# Patient Record
Sex: Male | Born: 1955 | Race: White | Hispanic: No | Marital: Married | Smoking: Never smoker
Health system: Southern US, Community
[De-identification: ages and names within clinical notes are randomized; demographics above are authoritative.]

---

## 2020-12-01 ENCOUNTER — Emergency Department (INDEPENDENT_AMBULATORY_CARE_PROVIDER_SITE_OTHER): Payer: Self-pay

## 2020-12-01 ENCOUNTER — Other Ambulatory Visit: Payer: Self-pay

## 2020-12-01 ENCOUNTER — Emergency Department
Admission: EM | Admit: 2020-12-01 | Discharge: 2020-12-01 | Disposition: A | Discharge status: Home / Self Care | Payer: Self-pay | Source: Home / Self Care

## 2020-12-01 DIAGNOSIS — J189 Pneumonia, unspecified organism: Secondary | ICD-10-CM

## 2020-12-01 DIAGNOSIS — U071 COVID-19: Secondary | ICD-10-CM

## 2020-12-01 DIAGNOSIS — J1282 Pneumonia due to coronavirus disease 2019: Secondary | ICD-10-CM

## 2020-12-01 MED ORDER — AMOXICILLIN-POT CLAVULANATE 875-125 MG PO TABS: 1.0000 | ORAL_TABLET | Freq: Two times a day (BID) | ORAL | 0 refills | Status: AC

## 2020-12-01 MED ORDER — DEXAMETHASONE SODIUM PHOSPHATE 10 MG/ML IJ SOLN
10.0000 mg | Freq: Once | INTRAMUSCULAR | Status: AC
  Administered 2020-12-01: 10 mg via INTRAMUSCULAR

## 2020-12-01 MED ORDER — AZITHROMYCIN 250 MG PO TABS: ORAL_TABLET | ORAL | 0 refills | Status: AC

## 2020-12-01 MED ORDER — AMOXICILLIN-POT CLAVULANATE 875-125 MG PO TABS: 1.0000 | ORAL_TABLET | Freq: Two times a day (BID) | ORAL | 0 refills | Status: DC

## 2020-12-01 MED ORDER — BENZONATATE 100 MG PO CAPS: 200.0000 mg | ORAL_CAPSULE | Freq: Three times a day (TID) | ORAL | 0 refills | Status: AC | PRN

## 2020-12-01 MED ORDER — PREDNISONE 20 MG PO TABS: 40.0000 mg | ORAL_TABLET | Freq: Every day | ORAL | 0 refills | Status: AC

## 2020-12-01 MED ORDER — AZITHROMYCIN 250 MG PO TABS: ORAL_TABLET | ORAL | 0 refills | Status: DC

## 2020-12-01 NOTE — ED Triage Notes (Signed)
Pt c/o cough, congestion and neck pain since Jan 2. Pt states at home test came up pos same day. States hes been having sharp shooting pain in his neck as well. Took one dose of 12 mg Ivermectin he had as an rx from travels to [place]. Also motrin prn.

## 2020-12-01 NOTE — ED Provider Notes (Signed)
[facility]    CSN: [id number] Arrival date & time: 12/01/20  1307      History   Chief Complaint Chief Complaint  Patient presents with  . Cough    Covid pos  . Neck Pain  . Nasal Congestion    HPI Gregory Huber is a 65 y.o. male.   HPI   Patient presents today for evaluation of a worsening cough, with shortness of breath, neck pain and nasal congestion.  Patient was diagnosed as COVID-19 positive approximately 8 days ago.  He is unvaccinated against COVID-19.  He has been managing symptoms with OTC medication along with taking 1 dose of ivermectin in which he acquired from and [place].  He reports symptoms have worsened.  He has a low-grade fever on arrival here today.  He has been checking oxygen levels at home and denies any readings consistent with hypoxia.  He denies any knowledge of any underlying conditions such as cardiovascular disease, diabetes, or chronic lung disease.  He has no previous history of smoking and is not obese.  History reviewed. No pertinent past medical history.  There are no problems to display for this patient.   History reviewed. No pertinent surgical history.     Home Medications    Prior to Admission medications   Not on File    Family History History reviewed. No pertinent family history.  Social History Social History   Tobacco Use  . Smoking status: Never Smoker  . Smokeless tobacco: Never Used  Vaping Use  . Vaping Use: Never used  Substance Use Topics  . Alcohol use: Not Currently     Allergies   Patient has no known allergies.   Review of Systems Review of Systems Pertinent negatives listed in HPI Physical Exam Triage Vital Signs ED Triage Vitals  Enc Vitals Group     BP 12/01/20 1514 124/80     Pulse Rate 12/01/20 1514 97     Resp 12/01/20 1514 18     Temp 12/01/20 1514 99.6 F (37.6 C)     Temp Source 12/01/20 1514 Oral     SpO2 12/01/20 1514 96 %     Weight --       Height --      Head Circumference --      Peak Flow --      Pain Score 12/01/20 1515 2     Pain Loc --      Pain Edu? --      Excl. in GC? --    No data found.  Updated Vital Signs BP 124/80 (BP Location: Right Arm)   Pulse 97   Temp 99.6 F (37.6 C) (Oral)   Resp 18   SpO2 96%   Visual Acuity Right Eye Distance:   Left Eye Distance:   Bilateral Distance:    Right Eye Near:   Left Eye Near:    Bilateral Near:     Physical Exam General appearance: alert, Ill-appearing, no distress Head: Normocephalic, without obvious abnormality, atraumatic ENT: mucosal edema, congestion, oropharynx w/o exudate Respiratory: Respirations even , unlabored, coarse lung sounds, (+) wheeze, decreases lung sound (generalized) Heart: rate and rhythm normal. No gallop or murmurs noted on exam  Abdomen: BS +, no distention, no rebound tenderness, or no mass Extremities: No gross deformities Skin: Skin color, texture, turgor normal. No rashes seen  Psych: Appropriate mood and affect. Neurologic: Mental status: Alert, oriented to person, place, and time, thought content appropriate.  UC Treatments / Results  Labs (all labs ordered are listed, but only abnormal results are displayed) Labs Reviewed - No data to display  EKG   Radiology DG Chest 2 View  Result Date: 12/01/2020 CLINICAL DATA:  Cough, COVID positive EXAM: CHEST - 2 VIEW COMPARISON:  None. FINDINGS: Streaky left lower lung opacities. No pleural effusion. Normal heart size. No pneumothorax. IMPRESSION: Streaky left lower lung opacities, suspect for pneumonia. Electronically Signed   By: Jasmine Pang M.D.   On: 12/01/2020 15:48    Procedures Procedures (including critical care time)  Medications Ordered in UC Medications - No data to display  Initial Impression / Assessment and Plan / UC Course  I have reviewed the triage vital signs and the nursing notes.  Pertinent labs & imaging results that were available during  my care of the patient were reviewed by me and considered in my medical decision making (see chart for details).     Chest x-ray consistent with a left lower lobe pneumonia.  This is likely secondary to COVID-19 diagnosis.  However patient is on the tail end of an actual viral infection.  Will cover ferning.  Community-acquired pneumonia.  Treatment prescribed Augmentin along with azithromycin.  Patient also prescribed prednisone.  Strict ER precautions given as patient is high risk for complications simply because he is unvaccinated.  Overall vital signs today are reassuring as he is not hypoxic although has some mild shortness of breath with activity.  Patient encouraged to monitor temperature and manage fever with Motrin and Tylenol.  Patient verbalized understanding and agreement with plan and indication of symptoms warranting evaluation in the setting of the emergency department. Final Clinical Impressions(s) / UC Diagnoses   Final diagnoses:  Pneumonia of left lower lobe due to infectious organism  COVID-19 virus infection, 11/22/20     Discharge Instructions     Start oral prednisone tomorrow 12/02/20.  You received a steroid injection here while in clinic therefore start the oral prednisone tomorrow.  You are being treated for community-acquired left lower lobe pneumonia secondary to a recent COVID-19 infection.  You will start Augmentin and Azithromycin and complete both medications. Follow-up at the [facility] respiratory clinic in 10 days.  You can contact their office by calling the number listed on your paperwork and schedule a COVID 19 pneumonia follow-up appointment.  Recommend obtaining a pulse oximetry monitor to check your oxygen level.  If your oxygen level drops below 90% and is consistently below 90% and you still have increased work of breathing or shortness of breath these are all indications to go immediately to the emergency department for further management and evaluation of  symptoms.    ED Prescriptions    Medication Sig Dispense Auth. Provider   amoxicillin-clavulanate (AUGMENTIN) 875-125 MG tablet  (Status: Discontinued) Take 1 tablet by mouth 2 (two) times daily. 20 tablet Bing Neighbors, FNP   azithromycin (ZITHROMAX) 250 MG tablet  (Status: Discontinued) Take 2 tabs PO x 1 dose, then 1 tab PO QD x 4 days 6 tablet Bing Neighbors, FNP   predniSONE (DELTASONE) 20 MG tablet Take 2 tablets (40 mg total) by mouth daily with breakfast. 10 tablet Bing Neighbors, FNP   azithromycin (ZITHROMAX) 250 MG tablet Take 2 tabs PO x 1 dose, then 1 tab PO QD x 4 days 6 tablet Bing Neighbors, FNP   benzonatate (TESSALON) 100 MG capsule Take 2 capsules (200 mg total) by mouth 3 (three) times daily as needed  for cough. 40 capsule Bing Neighbors, FNP   amoxicillin-clavulanate (AUGMENTIN) 875-125 MG tablet Take 1 tablet by mouth 2 (two) times daily. 20 tablet Bing Neighbors, FNP     PDMP not reviewed this encounter.   Bing Neighbors, [place] 12/05/20 [id number]

## 2020-12-01 NOTE — Discharge Instructions (Addendum)
Start oral prednisone tomorrow 12/02/20.  You received a steroid injection here while in clinic therefore start the oral prednisone tomorrow.  You are being treated for community-acquired left lower lobe pneumonia secondary to a recent COVID-19 infection.  You will start Augmentin and Azithromycin and complete both medications. Follow-up at the Post COVID respiratory clinic in 10 days.  You can contact their office by calling the number listed on your paperwork and schedule a COVID 19 pneumonia follow-up appointment.  Recommend obtaining a pulse oximetry monitor to check your oxygen level.  If your oxygen level drops below 90% and is consistently below 90% and you still have increased work of breathing or shortness of breath these are all indications to go immediately to the emergency department for further management and evaluation of symptoms.

## 2022-01-30 IMAGING — DX DG CHEST 2V
2 series · 2 of 2 positions shown · non-contrast
Comparison: None.

CLINICAL DATA: Cough, COVID positive

EXAM:
CHEST - 2 VIEW

[chest pa]
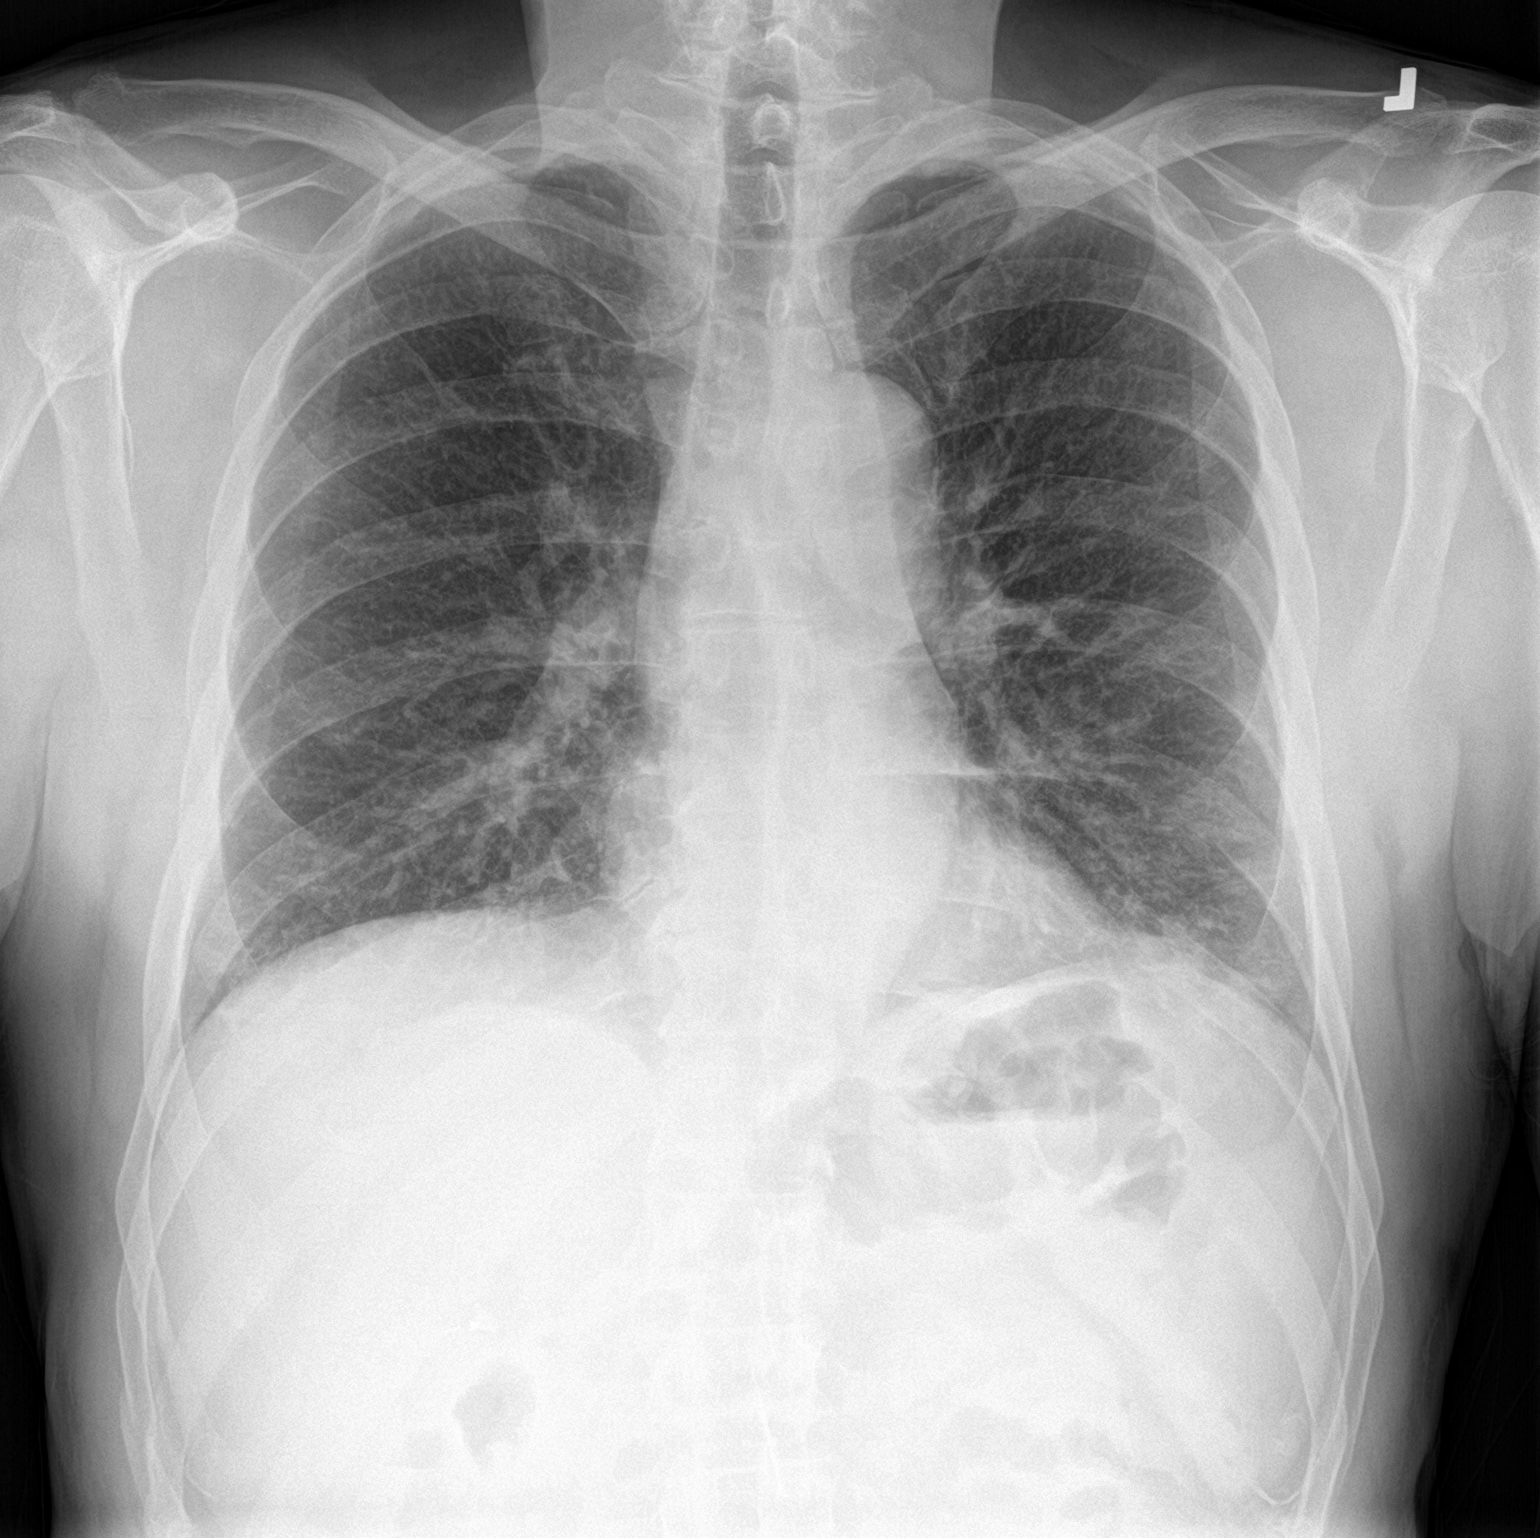

[chest lat]
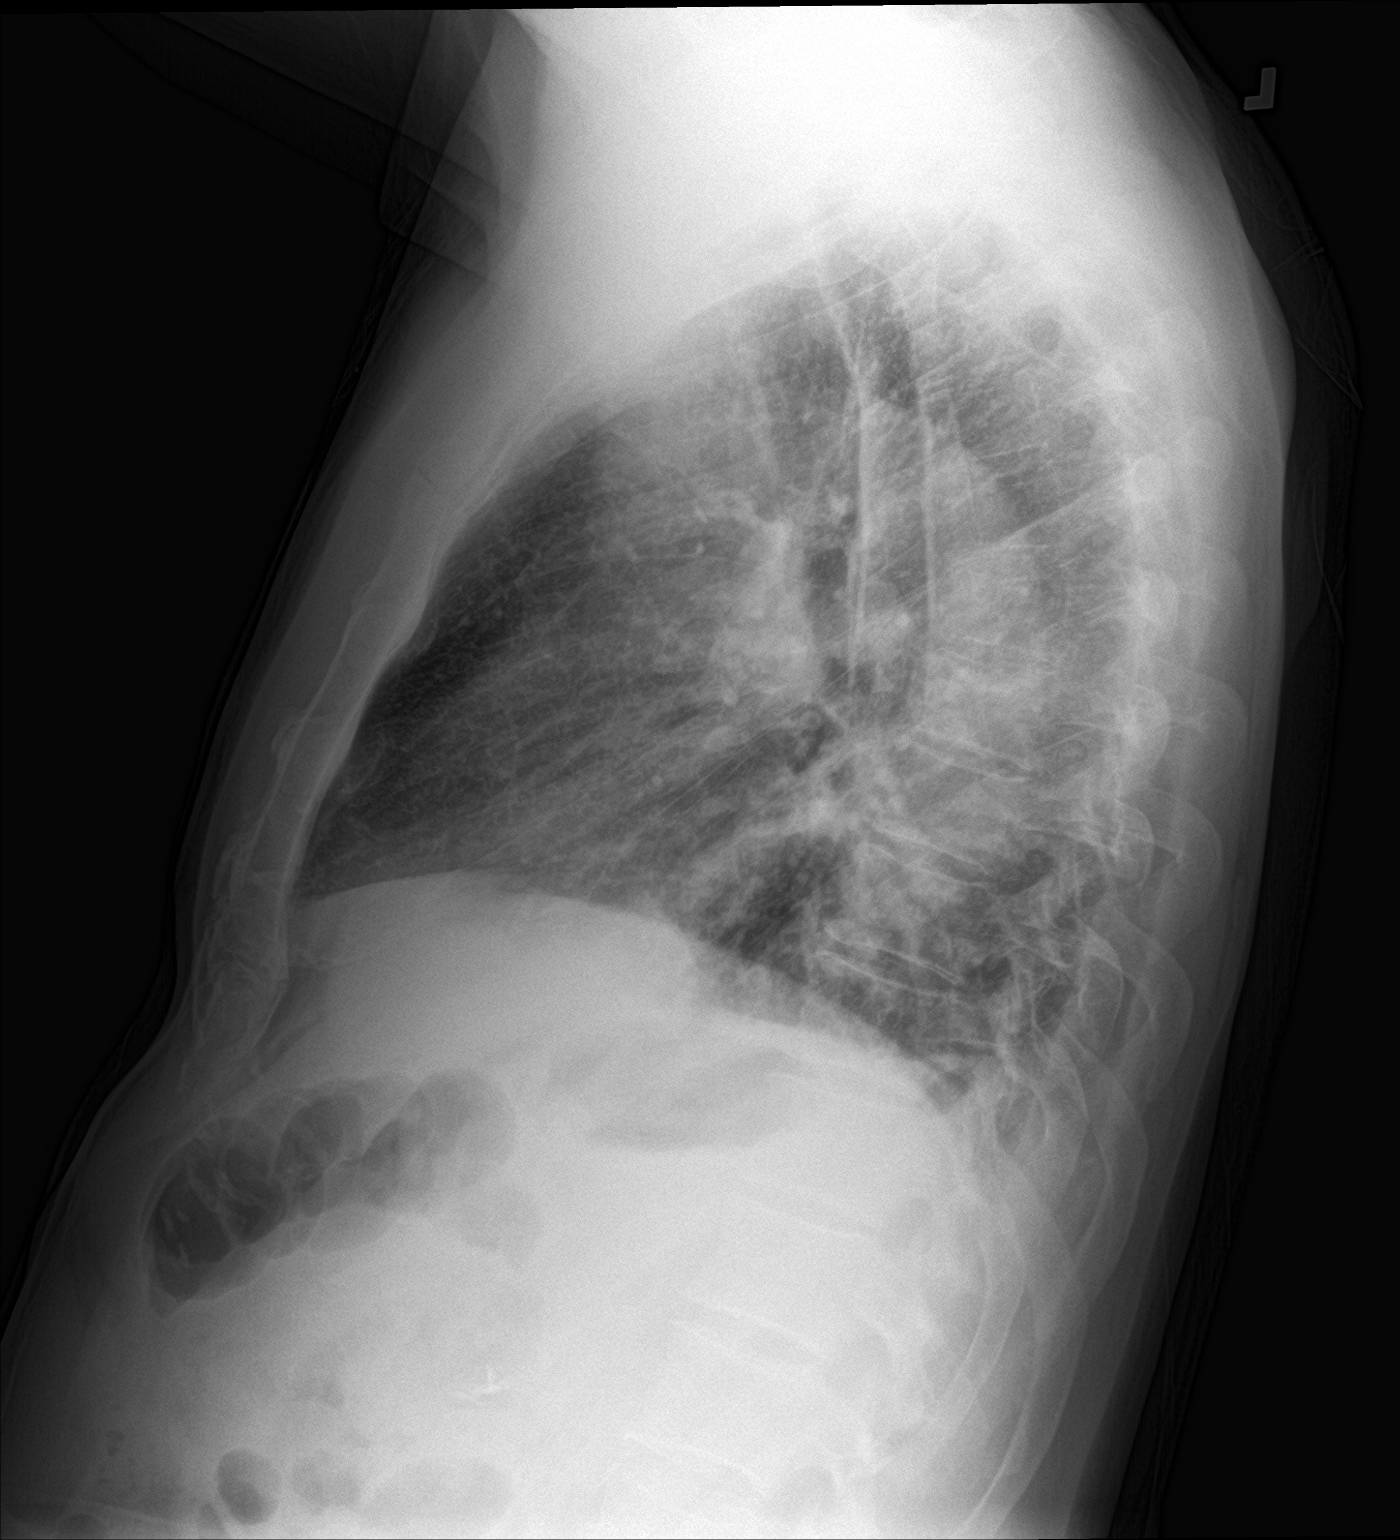

[2 of 2 positions shown; findings below may reference images not displayed]

FINDINGS: Streaky left lower lung opacities. No pleural effusion. Normal heart
size. No pneumothorax.
IMPRESSION: Streaky left lower lung opacities, suspect for pneumonia.
